# Patient Record
Sex: Male | Born: 1965 | Race: White | Hispanic: No | Marital: Married | Smoking: Former smoker
Health system: Southern US, Community
[De-identification: ages and names within clinical notes are randomized; demographics above are authoritative.]

## PROBLEM LIST (undated history)

## (undated) DIAGNOSIS — N62 Hypertrophy of breast: Secondary | ICD-10-CM

## (undated) DIAGNOSIS — L259 Unspecified contact dermatitis, unspecified cause: Secondary | ICD-10-CM

## (undated) HISTORY — DX: Hypertrophy of breast: N62

## (undated) HISTORY — PX: LITHOTRIPSY: SUR834

## (undated) HISTORY — DX: Unspecified contact dermatitis, unspecified cause: L25.9

---

## 2008-08-30 ENCOUNTER — Ambulatory Visit: Payer: Self-pay | Admitting: Gastroenterology

## 2009-09-08 HISTORY — PX: COLONOSCOPY: SHX174

## 2012-09-12 ENCOUNTER — Ambulatory Visit: Payer: Self-pay | Admitting: Emergency Medicine

## 2012-09-12 LAB — DOT URINE DIP
Blood: NEGATIVE
Protein: NEGATIVE
Specific Gravity: 1.015 (ref 1.003–1.030)

## 2012-09-17 ENCOUNTER — Ambulatory Visit: Payer: Self-pay

## 2014-08-21 ENCOUNTER — Ambulatory Visit: Payer: Self-pay | Admitting: Physician Assistant

## 2014-08-21 LAB — DOT URINE DIP
Blood: NEGATIVE
Glucose,UR: NEGATIVE
PROTEIN: NEGATIVE
SPECIFIC GRAVITY: 1.02 (ref 1.000–1.030)

## 2015-02-20 ENCOUNTER — Encounter: Payer: Self-pay | Admitting: Family Medicine

## 2015-02-20 ENCOUNTER — Ambulatory Visit (INDEPENDENT_AMBULATORY_CARE_PROVIDER_SITE_OTHER): Payer: BC Managed Care – PPO | Admitting: Family Medicine

## 2015-02-20 VITALS — BP 100/60 | HR 72 | Ht 70.0 in | Wt 202.0 lb

## 2015-02-20 DIAGNOSIS — L259 Unspecified contact dermatitis, unspecified cause: Secondary | ICD-10-CM

## 2015-02-20 HISTORY — DX: Unspecified contact dermatitis, unspecified cause: L25.9

## 2015-02-20 MED ORDER — PREDNISONE 10 MG PO TABS: ORAL_TABLET | ORAL | Status: DC

## 2015-02-20 NOTE — Progress Notes (Signed)
Name: Caleb Acosta   MRN: [id number]    DOB: 08/03/1966   Date:02/20/2015       Progress Note  Subjective  Chief Complaint  No chief complaint on file.   Rash This is a new problem. The current episode started in the past 7 days. The problem has been gradually improving since onset. The affected locations include the left hand and right hand. The rash is characterized by blistering, itchiness, swelling and redness. He was exposed to plant contact. Pertinent negatives include no anorexia, cough, diarrhea, facial edema, fever, joint pain, shortness of breath or sore throat. Past treatments include anti-itch cream, cold compress and antihistamine. The treatment provided moderate relief.    No problem-specific assessment & plan notes found for this encounter.   No past medical history on file.  No past surgical history on file.  No family history on file.  History   Social History  . Marital Status: Married    Spouse Name: N/A  . Number of Children: N/A  . Years of Education: N/A   Occupational History  . Not on file.   Social History Main Topics  . Smoking status: Former Games developer  . Smokeless tobacco: Not on file  . Alcohol Use: No  . Drug Use: No  . Sexual Activity: Not on file   Other Topics Concern  . Not on file   Social History Narrative  . No narrative on file    No Known Allergies   Review of Systems  Constitutional: Negative for fever, chills, weight loss and malaise/fatigue.  HENT: Negative for ear discharge, ear pain and sore throat.   Eyes: Negative for blurred vision.  Respiratory: Negative for cough, sputum production, shortness of breath and wheezing.   Cardiovascular: Negative for chest pain, palpitations and leg swelling.  Gastrointestinal: Negative for heartburn, nausea, abdominal pain, diarrhea, constipation, blood in stool, melena and anorexia.  Genitourinary: Negative for dysuria, urgency, frequency and hematuria.  Musculoskeletal:  Negative for myalgias, back pain, joint pain and neck pain.  Skin: Positive for rash.  Neurological: Negative for dizziness, tingling, sensory change, focal weakness and headaches.  Endo/Heme/Allergies: Negative for environmental allergies and polydipsia. Does not bruise/bleed easily.  Psychiatric/Behavioral: Negative for depression and suicidal ideas. The patient is not nervous/anxious and does not have insomnia.      Objective  Filed Vitals:   02/20/15 1527  Height: 5\' 10"  (1.778 m)  Weight: 202 lb (91.627 kg)    Physical Exam  Constitutional: He is oriented to person, place, and time and well-developed, well-nourished, and in no distress.  HENT:  Head: Normocephalic.  Right Ear: External ear normal.  Left Ear: External ear normal.  Nose: Nose normal.  Mouth/Throat: Oropharynx is clear and moist.  Eyes: Conjunctivae and EOM are normal. Pupils are equal, round, and reactive to light. Right eye exhibits no discharge. Left eye exhibits no discharge. No scleral icterus.  Neck: Normal range of motion. Neck supple. No JVD present. No tracheal deviation present. No thyromegaly present.  Cardiovascular: Normal rate, regular rhythm, normal heart sounds and intact distal pulses.  Exam reveals no gallop and no friction rub.   No murmur heard. Pulmonary/Chest: Breath sounds normal. No respiratory distress. He has no wheezes. He has no rales.  Abdominal: Soft. Bowel sounds are normal. He exhibits no mass. There is no hepatosplenomegaly. There is no tenderness. There is no rebound, no guarding and no CVA tenderness.  Musculoskeletal: Normal range of motion. He exhibits no edema or tenderness.  Lymphadenopathy:  He has no cervical adenopathy.  Neurological: He is alert and oriented to person, place, and time. He has normal sensation, normal strength, normal reflexes and intact cranial nerves. No cranial nerve deficit.  Skin: Skin is warm. Rash noted.  Vesicles /erythema   Psychiatric: Mood  and affect normal.      No results found for this or any previous visit (from the past 2160 hour(s)).   Assessment & Plan  Problem List Items Addressed This Visit    None        Dr. Elizabeth Sauer [facility]  02/20/2015

## 2015-03-08 ENCOUNTER — Ambulatory Visit (INDEPENDENT_AMBULATORY_CARE_PROVIDER_SITE_OTHER): Payer: BC Managed Care – PPO | Admitting: Family Medicine

## 2015-03-08 ENCOUNTER — Encounter: Payer: Self-pay | Admitting: Family Medicine

## 2015-03-08 VITALS — BP 110/80 | HR 62 | Ht 70.0 in | Wt 201.0 lb

## 2015-03-08 DIAGNOSIS — Z0000 Encounter for general adult medical examination without abnormal findings: Secondary | ICD-10-CM

## 2015-03-08 LAB — POCT URINALYSIS DIPSTICK
Bilirubin, UA: 0.2
Glucose, UA: NEGATIVE
KETONES UA: NEGATIVE
LEUKOCYTES UA: NEGATIVE
NITRITE UA: NEGATIVE
Protein, UA: NEGATIVE
RBC UA: NEGATIVE
SPEC GRAV UA: 1.01
Urobilinogen, UA: NEGATIVE
pH, UA: 5

## 2015-03-08 LAB — HEMOCCULT GUIAC POC 1CARD (OFFICE): Fecal Occult Blood, POC: NEGATIVE

## 2015-03-08 NOTE — Progress Notes (Signed)
Name: Caleb Acosta   MRN: [id number]    DOB: Dec 04, 1965   Date:03/08/2015       Progress Note  Subjective  Chief Complaint  Chief Complaint  Patient presents with  . Annual Exam    HPI Comments: Annual phy sical exam/ no concerns/ physical circumstances mental aspects.  preventitive for age discussed.   No problem-specific assessment & plan notes found for this encounter.   History reviewed. No pertinent past medical history.  Past Surgical History  Procedure Laterality Date  . Lithotripsy    . Colonoscopy  2011    Dr Servando SnareWohl- repeat in 5 years    History reviewed. No pertinent family history.  History   Social History  . Marital Status: Married    Spouse Name: N/A  . Number of Children: N/A  . Years of Education: N/A   Occupational History  . Not on file.   Social History Main Topics  . Smoking status: Former Games developermoker  . Smokeless tobacco: Not on file  . Alcohol Use: No  . Drug Use: No  . Sexual Activity: Yes   Other Topics Concern  . Not on file   Social History Narrative    No Known Allergies   ROS   Objective  Filed Vitals:   03/08/15 0840  BP: 110/80  Pulse: 62  Height: 5\' 10"  (1.778 m)  Weight: 201 lb (91.173 kg)    Physical Exam  Constitutional: He is oriented to person, place, and time and well-developed, well-nourished, and in no distress.  HENT:  Head: Normocephalic.  Right Ear: External ear normal.  Left Ear: External ear normal.  Nose: Nose normal.  Mouth/Throat: Oropharynx is clear and moist.  Eyes: Conjunctivae and EOM are normal. Pupils are equal, round, and reactive to light. Right eye exhibits no discharge. Left eye exhibits no discharge. No scleral icterus.  Neck: Normal range of motion. Neck supple. No JVD present. No tracheal deviation present. No thyromegaly present.  Cardiovascular: Normal rate, regular rhythm, normal heart sounds and intact distal pulses.  Exam reveals no gallop and no friction rub.   No murmur  heard. Pulmonary/Chest: Breath sounds normal. No respiratory distress. He has no wheezes. He has no rales. He exhibits no tenderness.  Abdominal: Soft. Bowel sounds are normal. He exhibits no mass. There is no hepatosplenomegaly. There is no tenderness. There is no rebound, no guarding and no CVA tenderness.  Genitourinary: Rectum normal and prostate normal. Guaiac negative stool.  Musculoskeletal: Normal range of motion. He exhibits no edema or tenderness.  Lymphadenopathy:    He has no cervical adenopathy.  Neurological: He is alert and oriented to person, place, and time. He has normal sensation, normal strength, normal reflexes and intact cranial nerves. No cranial nerve deficit.  Skin: Skin is warm. No rash noted.  Psychiatric: Mood and affect normal.  Nursing note and vitals reviewed.     Assessment & Plan  Problem List Items Addressed This Visit    None    Visit Diagnoses    Annual physical exam    -  Primary    Relevant Orders    POCT Occult Blood Stool    Renal Function Panel    Lipid Profile    PSA, total and free         Dr. Elizabeth Sauereanna Alegandro Macnaughton [facility]  03/08/2015

## 2015-03-09 LAB — RENAL FUNCTION PANEL
ALBUMIN: 4.4 g/dL (ref 3.5–5.5)
BUN/Creatinine Ratio: 22 — ABNORMAL HIGH (ref 9–20)
BUN: 16 mg/dL (ref 6–24)
CO2: 28 mmol/L (ref 18–29)
CREATININE: 0.74 mg/dL — AB (ref 0.76–1.27)
Calcium: 9.5 mg/dL (ref 8.7–10.2)
Chloride: 101 mmol/L (ref 97–108)
GFR calc non Af Amer: 108 mL/min/{1.73_m2} (ref >59)
GFR, EST AFRICAN AMERICAN: 124 mL/min/{1.73_m2} (ref >59)
GLUCOSE: 79 mg/dL (ref 65–99)
PHOSPHORUS: 3.2 mg/dL (ref 2.5–4.5)
Potassium: 4.8 mmol/L (ref 3.5–5.2)
Sodium: 141 mmol/L (ref 134–144)

## 2015-03-09 LAB — LIPID PANEL
Chol/HDL Ratio: 3.3 ratio units (ref 0.0–5.0)
Cholesterol, Total: 179 mg/dL (ref 100–199)
HDL: 55 mg/dL (ref >39)
LDL Calculated: 105 mg/dL — ABNORMAL HIGH (ref 0–99)
Triglycerides: 95 mg/dL (ref 0–149)
VLDL Cholesterol Cal: 19 mg/dL (ref 5–40)

## 2015-03-09 LAB — PSA, TOTAL AND FREE
PROSTATE SPECIFIC AG, SERUM: 0.5 ng/mL (ref 0.0–4.0)
PSA, Free Pct: 50 %
PSA, Free: 0.25 ng/mL

## 2016-08-13 ENCOUNTER — Ambulatory Visit
Admission: EM | Admit: 2016-08-13 | Discharge: 2016-08-13 | Disposition: A | Discharge status: Home / Self Care | Payer: Self-pay | Attending: Emergency Medicine | Admitting: Emergency Medicine

## 2016-08-13 ENCOUNTER — Encounter: Payer: Self-pay | Admitting: Emergency Medicine

## 2016-08-13 DIAGNOSIS — Z024 Encounter for examination for driving license: Secondary | ICD-10-CM

## 2016-08-13 DIAGNOSIS — H547 Unspecified visual loss: Secondary | ICD-10-CM

## 2016-08-13 LAB — DEPT OF TRANSP DIPSTICK, URINE (ARMC ONLY)
Glucose, UA: NEGATIVE mg/dL
HGB URINE DIPSTICK: NEGATIVE
Protein, ur: NEGATIVE mg/dL
Specific Gravity, Urine: 1.02 (ref 1.005–1.030)

## 2016-08-13 NOTE — ED Triage Notes (Signed)
Patient here for DOT Physical.  

## 2016-08-13 NOTE — ED Provider Notes (Signed)
CSN: [id number]     Arrival date & time 08/13/16  [id number] History   First MD Initiated Contact with Patient 08/13/16 0901     Chief Complaint  Patient presents with  . DOT Physical   (Consider location/radiation/quality/duration/timing/severity/associated sxs/prior Treatment) 50 year old male presents for routine DOT physical. Takes daily aspirin for CAD prevention. No chronic health issues. Takes no other medication. No complaints or concerns today.    The history is provided by the patient.    History reviewed. No pertinent past medical history. Past Surgical History:  Procedure Laterality Date  . COLONOSCOPY  2011   Dr Servando SnareWohl- repeat in 5 years  . LITHOTRIPSY     History reviewed. No pertinent family history. Social History  Substance Use Topics  . Smoking status: Former Games developermoker  . Smokeless tobacco: Never Used  . Alcohol use No    Review of Systems  Constitutional: Negative.   HENT: Negative.   Eyes: Negative.  Negative for visual disturbance.  Respiratory: Negative.   Cardiovascular: Negative.   Gastrointestinal: Negative.   Endocrine: Negative.   Genitourinary: Negative.   Musculoskeletal: Negative.   Skin: Negative.   Allergic/Immunologic: Negative.   Neurological: Negative.   Hematological: Negative.   Psychiatric/Behavioral: Negative.     Allergies  Patient has no known allergies.  Home Medications   Prior to Admission medications   Medication Sig Start Date End Date Taking? Authorizing Provider  aspirin 81 MG tablet Take 81 mg by mouth daily.    Historical Provider, MD   Meds Ordered and Administered this Visit  Medications - No data to display  BP 112/72 (BP Location: Left Arm)   Pulse 62   Temp 97.8 F (36.6 C) (Tympanic)   Resp 16   Ht 5' 9.5" (1.765 m)   Wt 211 lb (95.7 kg)   SpO2 100%   BMI 30.71 kg/m  No data found.   Physical Exam  Constitutional: He is oriented to person, place, and time. Vital signs are normal. He appears  well-developed and well-nourished. No distress.  HENT:  Head: Normocephalic and atraumatic.  Right Ear: Hearing, tympanic membrane, external ear and ear canal normal.  Left Ear: Hearing, tympanic membrane, external ear and ear canal normal.  Nose: Nose normal. Right sinus exhibits no maxillary sinus tenderness and no frontal sinus tenderness. Left sinus exhibits no maxillary sinus tenderness and no frontal sinus tenderness.  Mouth/Throat: Uvula is midline, oropharynx is clear and moist and mucous membranes are normal.  Eyes: Conjunctivae, EOM and lids are normal. Pupils are equal, round, and reactive to light.  Neck: Normal range of motion. Neck supple. No thyromegaly present.  Cardiovascular: Normal rate, regular rhythm, normal heart sounds, intact distal pulses and normal pulses.  Exam reveals no gallop and no friction rub.   No murmur heard. Pulmonary/Chest: Effort normal and breath sounds normal. No respiratory distress. He has no decreased breath sounds. He has no wheezes.  Abdominal: Soft. Bowel sounds are normal. He exhibits no distension and no mass. There is no tenderness. There is no rebound and no guarding. No hernia. Hernia confirmed negative in the right inguinal area and confirmed negative in the left inguinal area.  Musculoskeletal: Normal range of motion. He exhibits no edema, tenderness or deformity.  Lymphadenopathy:    He has no cervical adenopathy. No inguinal adenopathy noted on the right or left side.  Neurological: He is alert and oriented to person, place, and time. He has normal strength and normal reflexes. No cranial nerve  deficit or sensory deficit. He displays a negative Romberg sign.  Skin: Skin is warm and dry. Capillary refill takes less than 2 seconds. No rash noted.  Psychiatric: He has a normal mood and affect. His behavior is normal. Judgment and thought content normal.  Vitals reviewed.   Urgent Care Course   Clinical Course     Procedures (including  critical care time)  Labs Review Labs Reviewed  DEPT OF TRANSP DIPSTICK, URINE([facility] ONLY)    Imaging Review No results found.   Visual Acuity Review  Right Eye Distance: 20/50 uncorrected Left Eye Distance: 20/40 uncorrected Bilateral Distance: 20/30 uncorrected  Right Eye Near:   Left Eye Near:    Bilateral Near:         MDM   1. Encounter for commercial driver medical examination (CDME)   2. Decreased visual acuity    Discussed with patient that vision in right eye is 20/50 without correction but vision with both eyes is 20/30. Discussed that patient needs to see an Optometrist for further evaluation of decreased visual acuity. However, since he is 20/30 with both eyes and better than 20/40 which is the cut-off for passing the DOT exam, he will pass today. He will be certified for 2 years. Recommend follow-up with his Optometrist as planned and return in 2 years for next DOT exam.     Sudie GrumblingAnn Berry Caitlin Ainley, NP 08/13/16 2016  See Scanned DOT form for details.    Sudie GrumblingAnn Berry Cristianna Cyr, NP 08/13/16 2106

## 2016-08-13 NOTE — Discharge Instructions (Signed)
Recommend referral to Optometrist for corrective lenses. Since vision in both eyes is 20/30 and passes, will certify you for 2 years. However, you need to see the Optometrist for corrective lenses for vision to be improved to better than 20/40 in right eye in order to pass future exams.

## 2017-08-21 ENCOUNTER — Encounter: Payer: Self-pay | Admitting: Family Medicine

## 2017-08-21 ENCOUNTER — Ambulatory Visit: Payer: BC Managed Care – PPO | Admitting: Family Medicine

## 2017-08-21 VITALS — BP 102/60 | HR 68 | Ht 70.0 in | Wt 192.0 lb

## 2017-08-21 DIAGNOSIS — Z7689 Persons encountering health services in other specified circumstances: Secondary | ICD-10-CM | POA: Diagnosis not present

## 2017-08-21 DIAGNOSIS — Z23 Encounter for immunization: Secondary | ICD-10-CM

## 2017-08-21 DIAGNOSIS — N62 Hypertrophy of breast: Secondary | ICD-10-CM

## 2017-08-21 NOTE — Progress Notes (Signed)
Name: Caleb ClineDavid Lee Acosta   MRN: [id number]    DOB: 16-Nov-1965   Date:08/21/2017       Progress Note  Subjective  Chief Complaint  Chief Complaint  Patient presents with  . Establish Care    Patient present to reestablish care with physician. Patient concern about gynecomastia and ensueing quality of life    No problem-specific Assessment & Plan notes found for this encounter.   History reviewed. No pertinent past medical history.  Past Surgical History:  Procedure Laterality Date  . COLONOSCOPY  2011   Dr Servando SnareWohl- repeat in 5 years  . LITHOTRIPSY      History reviewed. No pertinent family history.  Social History   Socioeconomic History  . Marital status: Married    Spouse name: Not on file  . Number of children: Not on file  . Years of education: Not on file  . Highest education level: Not on file  Social Needs  . Financial resource strain: Not on file  . Food insecurity - worry: Not on file  . Food insecurity - inability: Not on file  . Transportation needs - medical: Not on file  . Transportation needs - non-medical: Not on file  Occupational History  . Not on file  Tobacco Use  . Smoking status: Former Games developermoker  . Smokeless tobacco: Never Used  Substance and Sexual Activity  . Alcohol use: No    Alcohol/week: 0.0 oz  . Drug use: No  . Sexual activity: Yes  Other Topics Concern  . Not on file  Social History Narrative  . Not on file    No Known Allergies  Outpatient Medications Prior to Visit  Medication Sig Dispense Refill  . aspirin 81 MG tablet Take 81 mg by mouth daily.     No facility-administered medications prior to visit.     Review of Systems  Constitutional: Negative for chills, fever, malaise/fatigue and weight loss.  HENT: Negative for ear discharge, ear pain and sore throat.   Eyes: Negative for blurred vision.  Respiratory: Negative for cough, sputum production, shortness of breath and wheezing.   Cardiovascular: Negative for  chest pain, palpitations and leg swelling.  Gastrointestinal: Negative for abdominal pain, blood in stool, constipation, diarrhea, heartburn, melena and nausea.  Genitourinary: Negative for dysuria, frequency, hematuria and urgency.  Musculoskeletal: Negative for back pain, joint pain, myalgias and neck pain.  Skin: Negative for rash.  Neurological: Negative for dizziness, tingling, sensory change, focal weakness and headaches.  Endo/Heme/Allergies: Negative for environmental allergies and polydipsia. Does not bruise/bleed easily.  Psychiatric/Behavioral: Negative for depression and suicidal ideas. The patient is not nervous/anxious and does not have insomnia.      Objective  Vitals:   08/21/17 1506  BP: 102/60  Pulse: 68  Weight: 192 lb (87.1 kg)  Height: 5\' 10"  (1.778 m)    Physical Exam  Constitutional: He is oriented to person, place, and time and well-developed, well-nourished, and in no distress.  HENT:  Head: Normocephalic.  Right Ear: External ear normal.  Left Ear: External ear normal.  Nose: Nose normal.  Mouth/Throat: Oropharynx is clear and moist.  Eyes: Conjunctivae and EOM are normal. Pupils are equal, round, and reactive to light. Right eye exhibits no discharge. Left eye exhibits no discharge. No scleral icterus.  Neck: Normal range of motion. Neck supple. No JVD present. No tracheal deviation present. No thyromegaly present.  Cardiovascular: Normal rate, regular rhythm, normal heart sounds and intact distal pulses. Exam reveals no gallop and no  friction rub.  No murmur heard. Pulmonary/Chest: Breath sounds normal. No respiratory distress. He has no wheezes. He has no rales. Right breast exhibits no inverted nipple, no mass, no nipple discharge, no skin change and no tenderness. Left breast exhibits no inverted nipple, no mass, no nipple discharge, no skin change and no tenderness. Breasts are symmetrical.  Gynecomastia noted/  Abdominal: Soft. Bowel sounds are  normal. He exhibits no mass. There is no hepatosplenomegaly. There is no tenderness. There is no rebound, no guarding and no CVA tenderness.  Musculoskeletal: Normal range of motion. He exhibits no edema or tenderness.  Lymphadenopathy:    He has no cervical adenopathy.  Neurological: He is alert and oriented to person, place, and time. He has normal sensation, normal strength, normal reflexes and intact cranial nerves. No cranial nerve deficit.  Skin: Skin is warm. No rash noted.  Psychiatric: Mood and affect normal.  Nursing note and vitals reviewed.     Assessment & Plan  Problem List Items Addressed This Visit    None    Visit Diagnoses    Establishing care with new doctor, encounter for    -  Primary   Gynecomastia       Relevant Orders   Ambulatory referral to General Surgery   Influenza vaccine needed       Relevant Orders   Flu Vaccine QUAD 36+ mos IM (Completed)      No orders of the defined types were placed in this encounter.     Dr. Hayden Rasmusseneanna Tarron Krolak [facility]  08/21/17

## 2017-08-27 ENCOUNTER — Ambulatory Visit (INDEPENDENT_AMBULATORY_CARE_PROVIDER_SITE_OTHER): Payer: BC Managed Care – PPO | Admitting: Surgery

## 2017-08-27 ENCOUNTER — Encounter: Payer: Self-pay | Admitting: Surgery

## 2017-08-27 VITALS — BP 123/77 | HR 80 | Temp 98.3°F | Ht 70.0 in | Wt 192.0 lb

## 2017-08-27 DIAGNOSIS — N62 Hypertrophy of breast: Secondary | ICD-10-CM | POA: Diagnosis not present

## 2017-08-27 DIAGNOSIS — Z1231 Encounter for screening mammogram for malignant neoplasm of breast: Secondary | ICD-10-CM | POA: Diagnosis not present

## 2017-08-27 NOTE — Patient Instructions (Addendum)
Please have your mammogram done and then we will see you back to give you your results.

## 2017-08-27 NOTE — Progress Notes (Signed)
Caleb ClineDavid Lee Acosta is an 51 y.o. male.   Chief Complaint: Gynecomastia  Referred by Dr. Yetta BarreJones  HPI: This patient with gynecomastia.  He has had this condition for greater than 10 years it causes him no pain but he states that he wanted to have something done about it and states that it is bothering him now when I asked him what he meant by "bothering" he pointed to his head suggesting that it is not physical pain etc.  He denies use of alcohol drugs or tobacco he stopped smoking 10 years ago Patient states that he stopped smoking and gained a lot of weight and lost weight recently as well. There is no family history of breast cancer not had any imaging done Patient reports that he is non-smoker nondrinker. Past Medical History:  Diagnosis Date  . Contact dermatitis 02/20/2015   Poison oak     Past Surgical History:  Procedure Laterality Date  . COLONOSCOPY  2011   Dr Servando SnareWohl- repeat in 5 years  . LITHOTRIPSY      No family history on file. Social History:  reports that he has quit smoking. he has never used smokeless tobacco. He reports that he does not drink alcohol or use drugs.  Allergies: No Known Allergies   (Not in a hospital admission)   Review of Systems:   Review of Systems  Constitutional: Negative.   HENT: Negative.   Eyes: Negative.   Respiratory: Negative.   Cardiovascular: Negative.   Gastrointestinal: Negative.   Genitourinary: Negative.   Musculoskeletal: Negative.   Skin: Negative.   Neurological: Negative.   Endo/Heme/Allergies: Negative.   Psychiatric/Behavioral: Negative.     Physical Exam:  Physical Exam  Constitutional: He is oriented to person, place, and time and well-developed, well-nourished, and in no distress. No distress.  HENT:  Head: Normocephalic and atraumatic.  Eyes: Pupils are equal, round, and reactive to light. Right eye exhibits no discharge. Left eye exhibits no discharge. No scleral icterus.  Neck: Normal range of motion.  No JVD present.  Cardiovascular: Normal rate and regular rhythm.  Pulmonary/Chest: Effort normal. No respiratory distress. He has no wheezes. He has no rales.  Breast exam:  Abdominal: Soft. He exhibits no distension. There is no tenderness. There is no rebound and no guarding.  Musculoskeletal: Normal range of motion. He exhibits no edema or tenderness.  Lymphadenopathy:    He has no cervical adenopathy.  Neurological: He is alert and oriented to person, place, and time.  Skin: Skin is warm and dry. No rash noted. He is not diaphoretic. No erythema.  Psychiatric: Mood and affect normal.  Vitals reviewed. Breast exam demonstrates no mass in either breast no axillary adenopathy there is no sign of glandular formation beneath the nipple to suggest gynecomastia.  There is diffuse fatty tissue gravity dependent. There were no vitals taken for this visit.    No results found for this or any previous visit (from the past 48 hour(s)). No results found.   Assessment/Plan Patient referred for gynecomastia.  I see no sign of glandular enlargement.  This is been going on for 10 years and he has had some weight gain than weight loss and this is all suggestive of fatty tissue.  This likely does not represent typical male gynecomastia.  I will obtain a mammogram bilaterally and follow-up in 2 weeks  Lattie Hawichard E Hiro Vipond, MD, FACS

## 2017-09-03 ENCOUNTER — Encounter: Payer: Self-pay | Admitting: Family Medicine

## 2017-09-03 ENCOUNTER — Ambulatory Visit (INDEPENDENT_AMBULATORY_CARE_PROVIDER_SITE_OTHER): Payer: BC Managed Care – PPO | Admitting: Family Medicine

## 2017-09-03 VITALS — BP 110/80 | HR 80 | Ht 70.0 in | Wt 195.0 lb

## 2017-09-03 DIAGNOSIS — E785 Hyperlipidemia, unspecified: Secondary | ICD-10-CM

## 2017-09-03 DIAGNOSIS — Z1211 Encounter for screening for malignant neoplasm of colon: Secondary | ICD-10-CM

## 2017-09-03 DIAGNOSIS — Z1159 Encounter for screening for other viral diseases: Secondary | ICD-10-CM | POA: Diagnosis not present

## 2017-09-03 DIAGNOSIS — Z0000 Encounter for general adult medical examination without abnormal findings: Secondary | ICD-10-CM

## 2017-09-03 DIAGNOSIS — Z23 Encounter for immunization: Secondary | ICD-10-CM | POA: Diagnosis not present

## 2017-09-03 DIAGNOSIS — R351 Nocturia: Secondary | ICD-10-CM

## 2017-09-03 LAB — HEMOCCULT GUIAC POC 1CARD (OFFICE): Fecal Occult Blood, POC: NEGATIVE

## 2017-09-03 NOTE — Progress Notes (Signed)
Name: Caleb ClineDavid Lee Acosta   MRN: [id number]    DOB: 04-16-1966   Date:09/03/2017       Progress Note  Subjective  Chief Complaint  Chief Complaint  Patient presents with  . Annual Exam    needs colonoscopy with Dr Servando SnareWohl- no issues, just time    Patient presents for annual physical exam.    No problem-specific Assessment & Plan notes found for this encounter.   Past Medical History:  Diagnosis Date  . 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 Gynecomastia, male     Past Surgical History:  Procedure Laterality Date  . COLONOSCOPY  2011   Dr Servando SnareWohl- repeat in 5 years  . LITHOTRIPSY      History reviewed. No pertinent family history.  Social History   Socioeconomic History  . Marital status: Married    Spouse name: Not on file  . Number of children: Not on file  . Years of education: Not on file  . Highest education level: Not on file  Social Needs  . Financial resource strain: Not on file  . Food insecurity - worry: Not on file  . Food insecurity - inability: Not on file  . Transportation needs - medical: Not on file  . Transportation needs - non-medical: Not on file  Occupational History  . Not on file  Tobacco Use  . Smoking status: Former Smoker    Types: Cigarettes    Last attempt to quit: 08/28/2007    Years since quitting: 10.0  . Smokeless tobacco: Never Used  Substance and Sexual Activity  . Alcohol use: No    Alcohol/week: 0.0 oz  . Drug use: No  . Sexual activity: Yes  Other Topics Concern  . Not on file  Social History Narrative  . Not on file    No Known Allergies  Outpatient Medications Prior to Visit  Medication Sig Dispense Refill  . aspirin 81 MG tablet Take 81 mg by mouth daily.     No facility-administered medications prior to visit.     Review of Systems  Constitutional: Negative for chills, fever, malaise/fatigue and weight loss.  HENT: Negative for ear discharge, ear pain and sore throat.   Eyes: Negative for blurred  vision.  Respiratory: Negative for cough, sputum production, shortness of breath and wheezing.   Cardiovascular: Negative for chest pain, palpitations and leg swelling.  Gastrointestinal: Negative for abdominal pain, blood in stool, constipation, diarrhea, heartburn, melena and nausea.  Genitourinary: Negative for dysuria, frequency, hematuria and urgency.       Nocturia  Musculoskeletal: Negative for back pain, joint pain, myalgias and neck pain.  Skin: Negative for rash.  Neurological: Negative for dizziness, tingling, sensory change, focal weakness and headaches.  Endo/Heme/Allergies: Negative for environmental allergies and polydipsia. Does not bruise/bleed easily.  Psychiatric/Behavioral: Negative for depression and suicidal ideas. The patient is not nervous/anxious and does not have insomnia.      Objective  Vitals:   09/03/17 0843  BP: 110/80  Pulse: 80  Weight: 195 lb (88.5 kg)  Height: 5\' 10"  (1.778 m)    Physical Exam  Constitutional: He is oriented to person, place, and time and well-developed, well-nourished, and in no distress.  HENT:  Head: Normocephalic and atraumatic.  Right Ear: Hearing, tympanic membrane, external ear and ear canal normal.  Left Ear: Hearing, tympanic membrane, external ear and ear canal normal.  Nose: Nose normal. No mucosal edema.  Mouth/Throat: Uvula is midline, oropharynx is clear and moist  and mucous membranes are normal. No oropharyngeal exudate, posterior oropharyngeal edema, posterior oropharyngeal erythema or tonsillar abscesses.  Eyes: Conjunctivae and EOM are normal. Pupils are equal, round, and reactive to light. Right eye exhibits no discharge. Left eye exhibits no discharge. No scleral icterus.  Fundoscopic exam:      The right eye shows no arteriolar narrowing and no AV nicking.       The left eye shows no arteriolar narrowing and no AV nicking.  Neck: Trachea normal and normal range of motion. Neck supple. Normal carotid pulses,  no hepatojugular reflux and no JVD present. Carotid bruit is not present. No tracheal deviation present. No thyroid mass and no thyromegaly present.  Cardiovascular: Normal rate, regular rhythm, S1 normal, S2 normal, normal heart sounds, intact distal pulses and normal pulses. PMI is not displaced. Exam reveals no gallop, no S3, no S4 and no friction rub.  No murmur heard. Pulmonary/Chest: Breath sounds normal. No respiratory distress. He has no wheezes. He has no rales. He exhibits no mass.  gynecomastia  Abdominal: Soft. Normal aorta and bowel sounds are normal. He exhibits no mass. There is no hepatosplenomegaly. There is no tenderness. There is no rebound, no guarding and no CVA tenderness.  Genitourinary: Rectum normal, prostate normal and testes/scrotum normal.  Musculoskeletal: Normal range of motion. He exhibits no edema or tenderness.       Thoracic back: Normal.       Lumbar back: Normal.  Lymphadenopathy:       Head (right side): No submandibular adenopathy present.       Head (left side): No submandibular adenopathy present.    He has no cervical adenopathy.  Neurological: He is alert and oriented to person, place, and time. He has normal motor skills, normal sensation, normal strength, normal reflexes and intact cranial nerves. No cranial nerve deficit.  Skin: Skin is warm and intact. No rash noted. He is not diaphoretic. No pallor.  Psychiatric: Mood and affect normal.  Nursing note and vitals reviewed.     Assessment & Plan  Problem List Items Addressed This Visit    None    Visit Diagnoses    Annual physical exam    -  Primary   Relevant Orders   Renal Function Panel   Colon cancer screening       Relevant Orders   Ambulatory referral to Gastroenterology   POCT occult blood stool (Completed)   Need for hepatitis C screening test       Relevant Orders   Hepatitis C antibody   Nocturia       Relevant Orders   PSA   Dyslipidemia       Relevant Orders   Lipid  panel   Need for diphtheria-tetanus-pertussis (Tdap) vaccine       Relevant Orders   Tdap vaccine greater than or equal to 7yo IM (Completed)      No orders of the defined types were placed in this encounter.     Dr. Hayden Rasmusseneanna Jones [facility]  09/03/17

## 2017-09-04 LAB — LIPID PANEL
CHOLESTEROL TOTAL: 192 mg/dL (ref 100–199)
Chol/HDL Ratio: 3.1 ratio (ref 0.0–5.0)
HDL: 61 mg/dL (ref >39)
LDL CALC: 122 mg/dL — AB (ref 0–99)
TRIGLYCERIDES: 46 mg/dL (ref 0–149)
VLDL CHOLESTEROL CAL: 9 mg/dL (ref 5–40)

## 2017-09-04 LAB — RENAL FUNCTION PANEL
Albumin: 4.3 g/dL (ref 3.5–5.5)
BUN / CREAT RATIO: 13 (ref 9–20)
BUN: 11 mg/dL (ref 6–24)
CALCIUM: 9.3 mg/dL (ref 8.7–10.2)
CHLORIDE: 101 mmol/L (ref 96–106)
CO2: 26 mmol/L (ref 20–29)
CREATININE: 0.86 mg/dL (ref 0.76–1.27)
GFR calc Af Amer: 114 mL/min/{1.73_m2} (ref >59)
GFR calc non Af Amer: 99 mL/min/{1.73_m2} (ref >59)
Glucose: 69 mg/dL (ref 65–99)
PHOSPHORUS: 3.8 mg/dL (ref 2.5–4.5)
Potassium: 4.7 mmol/L (ref 3.5–5.2)
SODIUM: 141 mmol/L (ref 134–144)

## 2017-09-04 LAB — PSA: Prostate Specific Ag, Serum: 0.5 ng/mL (ref 0.0–4.0)

## 2017-09-04 LAB — HEPATITIS C ANTIBODY: Hep C Virus Ab: <0.1 s/co ratio (ref 0.0–0.9)

## 2017-09-10 ENCOUNTER — Ambulatory Visit
Admission: RE | Admit: 2017-09-10 | Discharge: 2017-09-10 | Disposition: A | Discharge status: Home / Self Care | Payer: BC Managed Care – PPO | Source: Ambulatory Visit | Attending: Surgery | Admitting: Surgery

## 2017-09-10 ENCOUNTER — Ambulatory Visit (INDEPENDENT_AMBULATORY_CARE_PROVIDER_SITE_OTHER): Payer: BC Managed Care – PPO | Admitting: Surgery

## 2017-09-10 ENCOUNTER — Encounter: Payer: Self-pay | Admitting: Surgery

## 2017-09-10 VITALS — BP 120/78 | HR 67 | Temp 98.3°F | Ht 70.0 in | Wt 194.6 lb

## 2017-09-10 DIAGNOSIS — N62 Hypertrophy of breast: Secondary | ICD-10-CM

## 2017-09-10 NOTE — Progress Notes (Signed)
Outpatient Surgical Follow Up  09/10/2017  Caleb Acosta is an 52 y.o. male.   CC: Gynecomastia  HPI: This patient seen previously for gynecomastia and was sent for evaluation with radiology both mammogram and ultrasound.  Those have returned as normal.  Is concerned as to the cosmetic difficulties associated with his fatty tissue of the breast.  Past Medical History:  Diagnosis Date  . 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 Gynecomastia, male     Past Surgical History:  Procedure Laterality Date  . COLONOSCOPY  2011   Dr Servando Snare- repeat in 5 years  . LITHOTRIPSY      No family history on file.  Social History:  reports that he quit smoking about 10 years ago. His smoking use included cigarettes. he has never used smokeless tobacco. He reports that he does not drink alcohol or use drugs.  Allergies: No Known Allergies  Medications reviewed.   Review of Systems:   Review of Systems  Constitutional: Negative.   HENT: Negative.   Eyes: Negative.   Respiratory: Negative.   Cardiovascular: Negative.   Gastrointestinal: Negative.   Genitourinary: Negative.   Musculoskeletal: Negative.   Skin: Negative.   Neurological: Negative.   Endo/Heme/Allergies: Negative.   Psychiatric/Behavioral: Negative.      Physical Exam:  There were no vitals taken for this visit.  Physical Exam  Constitutional: He is oriented to person, place, and time and well-developed, well-nourished, and in no distress. No distress.  HENT:  Head: Normocephalic and atraumatic.  Eyes: Right eye exhibits no discharge. Left eye exhibits no discharge. No scleral icterus.  Neck: Normal range of motion.  Pulmonary/Chest: Effort normal. No respiratory distress.  Lymphadenopathy:    He has no cervical adenopathy.  Neurological: He is alert and oriented to person, place, and time.  Skin: Skin is warm and dry. He is not diaphoretic.  Vitals reviewed.  Bilateral breast exam demonstrates fatty  infiltration of both breasts with no nodularity no dominant mass and no gynecomastia beneath the nipple.  No results found for this or any previous visit (from the past 48 hour(s)). US Breast Ltd Uni Left Inc Axilla  Result Date: 09/10/2017 CLINICAL DATA:  Bilateral breast enlargement. Area of palpable concern in the right breast upper outer quadrant. EXAM: 2D DIGITAL DIAGNOSTIC BILATERAL MAMMOGRAM WITH CAD AND ADJUNCT TOMO ULTRASOUND BILATERAL BREAST COMPARISON:  Previous exam(s). ACR Breast Density Category a: The breast tissue is almost entirely fatty. FINDINGS: Mammographically, there are no suspicious masses, areas of architectural distortion or microcalcifications in either breast. The bilateral breast enlargement is due to fat accumulation. Mammographic images were processed with CAD. On physical exam, no suspicious masses are palpated. Targeted bile ultrasound is performed, showing no suspicious masses or shadowing lesions. IMPRESSION: No mammographic or sonographic evidence of malignancy in either breast. RECOMMENDATION: Further management of patient's right breast palpable area, and bilateral breast enlargement, should be based on clinical grounds. I have discussed the findings and recommendations with the patient. Results were also provided in writing at the conclusion of the visit. If applicable, a reminder letter will be sent to the patient regarding the next appointment. BI-RADS CATEGORY  2: Benign. Electronically Signed   By: Ted Mcalpine M.D.   On: 09/10/2017 10:31   US Breast Ltd Uni Right Inc Axilla  Result Date: 09/10/2017 CLINICAL DATA:  Bilateral breast enlargement. Area of palpable concern in the right breast upper outer quadrant. EXAM: 2D DIGITAL DIAGNOSTIC BILATERAL MAMMOGRAM WITH CAD AND ADJUNCT  TOMO ULTRASOUND BILATERAL BREAST COMPARISON:  Previous exam(s). ACR Breast Density Category a: The breast tissue is almost entirely fatty. FINDINGS: Mammographically, there are no  suspicious masses, areas of architectural distortion or microcalcifications in either breast. The bilateral breast enlargement is due to fat accumulation. Mammographic images were processed with CAD. On physical exam, no suspicious masses are palpated. Targeted bile ultrasound is performed, showing no suspicious masses or shadowing lesions. IMPRESSION: No mammographic or sonographic evidence of malignancy in either breast. RECOMMENDATION: Further management of patient's right breast palpable area, and bilateral breast enlargement, should be based on clinical grounds. I have discussed the findings and recommendations with the patient. Results were also provided in writing at the conclusion of the visit. If applicable, a reminder letter will be sent to the patient regarding the next appointment. BI-RADS CATEGORY  2: Benign. Electronically Signed   By: Ted Mcalpineobrinka  Dimitrova M.D.   On: 09/10/2017 10:31   Mm Diag Breast Tomo Bilateral  Result Date: 09/10/2017 CLINICAL DATA:  Bilateral breast enlargement. Area of palpable concern in the right breast upper outer quadrant. EXAM: 2D DIGITAL DIAGNOSTIC BILATERAL MAMMOGRAM WITH CAD AND ADJUNCT TOMO ULTRASOUND BILATERAL BREAST COMPARISON:  Previous exam(s). ACR Breast Density Category a: The breast tissue is almost entirely fatty. FINDINGS: Mammographically, there are no suspicious masses, areas of architectural distortion or microcalcifications in either breast. The bilateral breast enlargement is due to fat accumulation. Mammographic images were processed with CAD. On physical exam, no suspicious masses are palpated. Targeted bile ultrasound is performed, showing no suspicious masses or shadowing lesions. IMPRESSION: No mammographic or sonographic evidence of malignancy in either breast. RECOMMENDATION: Further management of patient's right breast palpable area, and bilateral breast enlargement, should be based on clinical grounds. I have discussed the findings and  recommendations with the patient. Results were also provided in writing at the conclusion of the visit. If applicable, a reminder letter will be sent to the patient regarding the next appointment. BI-RADS CATEGORY  2: Benign. Electronically Signed   By: Ted Mcalpineobrinka  Dimitrova M.D.   On: 09/10/2017 10:31    Assessment/Plan:  Films are reviewed.  No sign of malignancy.  This patient is concerned about the cosmetic issues surrounding his fatty infiltration of the breast.  I see no sign of malignancy no need for surgical intervention for diagnosis.  It is clearly some fatty infiltration of the breast bilaterally.  I discussed with him his cosmetic results.  I suggested that if he were to want to pursue this a plastic surgeon could perform and a minimally invasive procedure such as liposuction etc. we can refer him as needed.  Follow-up with us on an as-needed basis as well  Lattie Hawichard E Travious Vanover, MD, FACS

## 2017-09-10 NOTE — Patient Instructions (Signed)
We will send the referral to [facility].  Someone from their office will call you to schedule an appointment.  If you do not hear from someone in 5-7 days days please call our office and let us know so we can check on this for you.  Please call our office with any questions or concerns.

## 2017-09-16 ENCOUNTER — Telehealth: Payer: Self-pay | Admitting: Surgery

## 2017-09-16 NOTE — Telephone Encounter (Signed)
I have faxed a referral to Dr Margarita Sermonsoan's office for gynecomastia. This was faxed to [phone] The office will call the patient for appointment, however the first available is not till after February.   All clinic notes, imaging and demographics have also been faxed.   Phone# [phone].

## 2017-09-21 ENCOUNTER — Telehealth: Payer: Self-pay

## 2017-09-21 NOTE — Telephone Encounter (Signed)
Gastroenterology Pre-Procedure Review  Request Date: Requesting Physician: Dr.   PATIENT REVIEW QUESTIONS: The patient responded to the following health history questions as indicated:    1. Are you having any GI issues? No  2. Do you have a personal history of Polyps? Yes  3. Do you have a family history of Colon Cancer or Polyps? No  4. Diabetes Mellitus? No  5. Joint replacements in the past 12 months? No  6. Major health problems in the past 3 months? No  7. Any artificial heart valves, MVP, or defibrillator? No     MEDICATIONS & ALLERGIES:    Patient reports the following regarding taking any anticoagulation/antiplatelet therapy:   Plavix, Coumadin, Eliquis, Xarelto, Lovenox, Pradaxa, Brilinta, or Effient? No  Aspirin? No   Patient confirms/reports the following medications:  No current outpatient medications on file.   No current facility-administered medications for this visit.     Patient confirms/reports the following allergies:  No Known Allergies  No orders of the defined types were placed in this encounter.   AUTHORIZATION INFORMATION Primary Insurance: 1D#: Group #:  Secondary Insurance: 1D#: Group #:  SCHEDULE INFORMATION: Date: 10/05/17 Time: Location: [place]

## 2017-09-24 ENCOUNTER — Other Ambulatory Visit: Payer: Self-pay

## 2017-09-24 ENCOUNTER — Telehealth: Payer: Self-pay

## 2017-09-24 DIAGNOSIS — Z1211 Encounter for screening for malignant neoplasm of colon: Secondary | ICD-10-CM

## 2017-09-24 NOTE — Telephone Encounter (Signed)
Gastroenterology Pre-Procedure Review  Request Date: 10/05/17 Requesting Physician: Dr. Servando SnareWohl  PATIENT REVIEW QUESTIONS: The patient responded to the following health history questions as indicated:    1. Are you having any GI issues? no 2. Do you have a personal history of Polyps? yes (unsure of last colonoscopy) 3. Do you have a family history of Colon Cancer or Polyps? no 4. Diabetes Mellitus? no 5. Joint replacements in the past 12 months?no 6. Major health problems in the past 3 months?no 7. Any artificial heart valves, MVP, or defibrillator?no    MEDICATIONS & ALLERGIES:    Patient reports the following regarding taking any anticoagulation/antiplatelet therapy:   Plavix, Coumadin, Eliquis, Xarelto, Lovenox, Pradaxa, Brilinta, or Effient? no Aspirin? no  Patient confirms/reports the following medications:  No current outpatient medications on file.   No current facility-administered medications for this visit.     Patient confirms/reports the following allergies:  No Known Allergies  No orders of the defined types were placed in this encounter.   AUTHORIZATION INFORMATION Primary Insurance: 1D#: Group #:  Secondary Insurance: 1D#: Group #:  SCHEDULE INFORMATION: Date: 10/05/17 Time: Location:[facility]

## 2017-09-29 ENCOUNTER — Other Ambulatory Visit: Payer: Self-pay

## 2017-09-29 MED ORDER — PEG 3350-KCL-NABCB-NACL-NASULF 236 G PO SOLR: ORAL | 0 refills | Status: DC

## 2017-09-30 ENCOUNTER — Telehealth: Payer: Self-pay | Admitting: Gastroenterology

## 2017-09-30 ENCOUNTER — Encounter: Payer: Self-pay | Admitting: *Deleted

## 2017-09-30 ENCOUNTER — Encounter: Payer: Self-pay | Admitting: Student in an Organized Health Care Education/Training Program

## 2017-09-30 ENCOUNTER — Other Ambulatory Visit: Payer: Self-pay

## 2017-09-30 NOTE — Telephone Encounter (Signed)
Patient wants to cancel colonoscopy due to insurance no paying on procedure.

## 2017-10-05 ENCOUNTER — Ambulatory Visit
Admission: RE | Admit: 2017-10-05 | Payer: BC Managed Care – PPO | Source: Ambulatory Visit | Admitting: Gastroenterology

## 2017-10-05 ENCOUNTER — Telehealth: Payer: Self-pay | Admitting: Surgery

## 2017-10-05 SURGERY — COLONOSCOPY WITH PROPOFOL
Anesthesia: Choice

## 2017-10-05 NOTE — Telephone Encounter (Signed)
Patient was referred to Dr Meriam Spragueoan per Dr Excell Seltzerooper for gynecomastia.   Appt with Dr Meriam Spragueoan was on 09/23/17. Patient's surgery has been scheduled with Dr Meriam Spragueoan on 10/22/17.

## 2018-09-06 ENCOUNTER — Ambulatory Visit (INDEPENDENT_AMBULATORY_CARE_PROVIDER_SITE_OTHER): Payer: BC Managed Care – PPO | Admitting: Family Medicine

## 2018-09-06 ENCOUNTER — Encounter: Payer: Self-pay | Admitting: Family Medicine

## 2018-09-06 VITALS — BP 120/80 | HR 80 | Ht 70.0 in | Wt 200.0 lb

## 2018-09-06 DIAGNOSIS — R351 Nocturia: Secondary | ICD-10-CM

## 2018-09-06 DIAGNOSIS — Z0000 Encounter for general adult medical examination without abnormal findings: Secondary | ICD-10-CM | POA: Diagnosis not present

## 2018-09-06 NOTE — Progress Notes (Addendum)
Date:  09/06/2018   Name:  Caleb Acosta   DOB:  [birth year]   MRN:  [id number]   Chief Complaint: Annual Exam (no concerns)  Patient is a 52 year old male who presents for a comprehensive physical exam. The patient reports the following problems: none. Health maintenance has been reviewed up to date.   Review of Systems  Constitutional: Negative for appetite change, chills, fatigue, fever and unexpected weight change.  HENT: Negative for drooling, ear discharge, ear pain, facial swelling, hearing loss, nosebleeds, sneezing, sore throat and trouble swallowing.   Eyes: Negative for photophobia, pain, discharge, redness, itching and visual disturbance.  Respiratory: Negative for cough, choking, chest tightness, shortness of breath and wheezing.   Cardiovascular: Negative for chest pain, palpitations and leg swelling.  Gastrointestinal: Negative for abdominal pain, blood in stool, constipation, diarrhea, nausea, rectal pain and vomiting.  Endocrine: Negative for cold intolerance, heat intolerance, polydipsia, polyphagia and polyuria.  Genitourinary: Negative for decreased urine volume, difficulty urinating, discharge, dysuria, flank pain, frequency, hematuria, penile pain, penile swelling, scrotal swelling, testicular pain and urgency.  Musculoskeletal: Negative for back pain, joint swelling, myalgias, neck pain and neck stiffness.  Skin: Negative for color change and rash.  Allergic/Immunologic: Negative for environmental allergies and immunocompromised state.  Neurological: Negative for dizziness, tremors, seizures, syncope, speech difficulty, weakness, light-headedness, numbness and headaches.  Hematological: Does not bruise/bleed easily.  Psychiatric/Behavioral: Negative for agitation, behavioral problems, confusion, dysphoric mood, hallucinations, self-injury and suicidal ideas. The patient is not nervous/anxious.     Patient Active Problem List   Diagnosis Date Noted  .  Contact dermatitis 02/20/2015    No Known Allergies  Past Surgical History:  Procedure Laterality Date  . COLONOSCOPY  2011   Dr Servando Snare- repeat in 5 years  . LITHOTRIPSY      Social History   Tobacco Use  . Smoking status: Former Smoker    Types: Cigarettes    Last attempt to quit: 08/28/2007    Years since quitting: 11.0  . Smokeless tobacco: Never Used  Substance Use Topics  . Alcohol use: No    Alcohol/week: 0.0 standard drinks  . Drug use: No     Medication list has been reviewed and updated.  No outpatient medications have been marked as taking for the 09/06/18 encounter (Office Visit) with Duanne Limerick, MD.    [facility] 2/9 Scores 09/06/2018 09/03/2017 02/20/2015  PHQ - 2 Score 0 0 0  PHQ- 9 Score 0 0 -    Physical Exam Vitals signs and nursing note reviewed.  Constitutional:      Appearance: Normal appearance.  HENT:     Head: Normocephalic.     Right Ear: Hearing, tympanic membrane, ear canal and external ear normal. No decreased hearing noted.     Left Ear: Hearing, tympanic membrane, ear canal and external ear normal. No decreased hearing noted.     Nose: Nose normal. No nasal deformity.     Right Turbinates: Not enlarged.     Left Turbinates: Not enlarged.     Mouth/Throat:     Lips: Pink.     Mouth: Mucous membranes are moist. Mucous membranes are pale.     Palate: No mass and lesions.     Pharynx: Oropharynx is clear. Uvula midline. No pharyngeal swelling or oropharyngeal exudate.     Tonsils: No tonsillar exudate or tonsillar abscesses.  Eyes:     General: Lids are normal. No scleral icterus.  Right eye: No discharge.        Left eye: No discharge.     Conjunctiva/sclera: Conjunctivae normal.     Pupils: Pupils are equal, round, and reactive to light.     Funduscopic exam:    Right eye: Red reflex present.        Left eye: Red reflex present. Neck:     Musculoskeletal: Normal range of motion and neck supple. No edema or erythema.      Thyroid: No thyroid mass, thyromegaly or thyroid tenderness.     Vascular: Normal carotid pulses. No carotid bruit, hepatojugular reflux or JVD.     Trachea: Trachea normal. No tracheal deviation.  Cardiovascular:     Rate and Rhythm: Normal rate and regular rhythm.     Chest Wall: PMI is not displaced.     Pulses:          Carotid pulses are 2+ on the right side and 2+ on the left side.      Radial pulses are 2+ on the right side and 2+ on the left side.       Femoral pulses are 2+ on the right side and 2+ on the left side.      Popliteal pulses are 2+ on the right side and 2+ on the left side.       Dorsalis pedis pulses are 2+ on the right side and 2+ on the left side.       Posterior tibial pulses are 2+ on the right side and 2+ on the left side.     Heart sounds: Normal heart sounds, S1 normal and S2 normal. No murmur. No systolic murmur. No friction rub. No gallop. No S3 or S4 sounds.   Pulmonary:     Effort: No respiratory distress.     Breath sounds: Normal breath sounds. No decreased breath sounds, wheezing, rhonchi or rales.  Chest:     Chest wall: No mass.     Breasts: Breasts are symmetrical.        Right: Normal.        Left: Normal.     Comments: Noted scars Abdominal:     General: Bowel sounds are normal. There is no abdominal bruit.     Palpations: Abdomen is soft. There is no mass.     Tenderness: There is no abdominal tenderness. There is no guarding or rebound.     Hernia: There is no hernia in the right inguinal area or left inguinal area.  Genitourinary:    Scrotum/Testes: Normal.        Right: Mass or tenderness not present.        Left: Mass or tenderness not present.     Epididymis:     Right: Normal.     Left: Normal.     Prostate: Normal. Not enlarged, not tender and no nodules present.     Rectum: Normal. Guaiac result negative. No mass.  Musculoskeletal: Normal range of motion.        General: No tenderness.     Right lower leg: No edema.     Left  lower leg: No edema.  Lymphadenopathy:     Head:     Right side of head: No submandibular adenopathy.     Left side of head: No submandibular adenopathy.     Cervical: No cervical adenopathy.     Upper Body:     Right upper body: No axillary adenopathy.     Left upper body: No  axillary adenopathy.  Skin:    General: Skin is warm.     Capillary Refill: Capillary refill takes less than 2 seconds.     Coloration: Skin is not jaundiced or pale.     Findings: Abrasion present. No rash.     Comments: Abrasion ear  Neurological:     General: No focal deficit present.     Mental Status: He is alert and oriented to person, place, and time.     Cranial Nerves: Cranial nerves are intact. No cranial nerve deficit.     Sensory: Sensation is intact.     Motor: Motor function is intact. No weakness.     Deep Tendon Reflexes: Reflexes are normal and symmetric.  Psychiatric:        Behavior: Behavior is cooperative.     BP 120/80   Pulse 80   Ht 5\' 10"  (1.778 m)   Wt 200 lb (90.7 kg)   BMI 28.70 kg/m   Assessment and Plan: 1. Annual physical exam No subjective/objective concerns noted on history nor physical.  Will check lipid panel and renal panel. - Lipid panel - Renal function panel Caleb Acosta is a 52 y.o. male who presents today for his Complete Annual Exam. He feels well. He reports exercising. He reports he is sleeping well. Immunizations are reviewed and recommendations provided.   Age appropriate screening tests are discussed. Counseling given for risk factor reduction interventions. 2. Nocturia Patient relates occasional nocturia usually on a nightly basis will check PSA. - PSA - CBC with Differential/Platelet

## 2018-09-16 ENCOUNTER — Other Ambulatory Visit: Payer: BC Managed Care – PPO

## 2018-09-16 DIAGNOSIS — R351 Nocturia: Secondary | ICD-10-CM

## 2018-09-16 DIAGNOSIS — E785 Hyperlipidemia, unspecified: Secondary | ICD-10-CM

## 2018-09-17 LAB — CBC WITH DIFFERENTIAL/PLATELET
BASOS ABS: 0.1 10*3/uL (ref 0.0–0.2)
BASOS: 1 %
EOS (ABSOLUTE): 0.2 10*3/uL (ref 0.0–0.4)
Eos: 3 %
Hematocrit: 44.5 % (ref 37.5–51.0)
Hemoglobin: 14.8 g/dL (ref 13.0–17.7)
Immature Grans (Abs): 0 10*3/uL (ref 0.0–0.1)
Immature Granulocytes: 0 %
Lymphocytes Absolute: 1.7 10*3/uL (ref 0.7–3.1)
Lymphs: 27 %
MCH: 29.6 pg (ref 26.6–33.0)
MCHC: 33.3 g/dL (ref 31.5–35.7)
MCV: 89 fL (ref 79–97)
MONOS ABS: 0.5 10*3/uL (ref 0.1–0.9)
Monocytes: 8 %
NEUTROS ABS: 4 10*3/uL (ref 1.4–7.0)
NEUTROS PCT: 61 %
PLATELETS: 240 10*3/uL (ref 150–450)
RBC: 5 x10E6/uL (ref 4.14–5.80)
RDW: 13 % (ref 11.6–15.4)
WBC: 6.4 10*3/uL (ref 3.4–10.8)

## 2018-09-17 LAB — LIPID PANEL WITH LDL/HDL RATIO
CHOLESTEROL TOTAL: 185 mg/dL (ref 100–199)
HDL: 57 mg/dL (ref >39)
LDL CALC: 116 mg/dL — AB (ref 0–99)
LDL/HDL RATIO: 2 ratio (ref 0.0–3.6)
TRIGLYCERIDES: 58 mg/dL (ref 0–149)
VLDL CHOLESTEROL CAL: 12 mg/dL (ref 5–40)

## 2018-09-17 LAB — PSA: Prostate Specific Ag, Serum: 0.5 ng/mL (ref 0.0–4.0)

## 2019-05-09 ENCOUNTER — Ambulatory Visit: Payer: Self-pay | Admitting: Family Medicine

## 2019-05-09 ENCOUNTER — Encounter: Payer: Self-pay | Admitting: Family Medicine

## 2019-05-09 ENCOUNTER — Other Ambulatory Visit: Payer: Self-pay

## 2019-05-09 VITALS — BP 100/60 | HR 72 | Ht 70.0 in | Wt 190.0 lb

## 2019-05-09 DIAGNOSIS — Z23 Encounter for immunization: Secondary | ICD-10-CM

## 2019-05-09 DIAGNOSIS — R55 Syncope and collapse: Secondary | ICD-10-CM

## 2019-05-09 NOTE — Progress Notes (Signed)
Date:  05/09/2019   Name:  Caleb Acosta   DOB:  Oct 08, 1965   MRN:  [id number]   Chief Complaint: Epistaxis (started sat night, got dizzy and felt light- headed. EMS came to house- b/p was normal.) and influenza vacc need  Epistaxis  The bleeding has been from the right nare. This is a new problem. Episode frequency: one episode. The problem has been resolved. The bleeding is associated with nothing. He has tried nothing for the symptoms.  Loss of Consciousness Chronicity: for near syncope. The current episode started in the past 7 days (saturday night). There was no loss of consciousness. Exacerbated by: nose bleed. Associated symptoms include dizziness and light-headedness. Pertinent negatives include no abdominal pain, back pain, chest pain, diaphoresis, fever, focal sensory loss, headaches, nausea, palpitations or slurred speech. Associated symptoms comments: Just felt lightheaded/hypotensive. The treatment provided mild relief. There is no history of arrhythmia, CAD, a clotting disorder, CVA, DM, HTN, seizures, a sudden death in family, TIA or vertigo.    Review of Systems  Constitutional: Negative for chills, diaphoresis and fever.  HENT: Positive for nosebleeds. Negative for drooling, ear discharge, ear pain and sore throat.   Respiratory: Negative for cough, shortness of breath and wheezing.   Cardiovascular: Positive for syncope. Negative for chest pain, palpitations and leg swelling.  Gastrointestinal: Negative for abdominal pain, blood in stool, constipation, diarrhea and nausea.  Endocrine: Negative for polydipsia.  Genitourinary: Negative for dysuria, frequency, hematuria and urgency.  Musculoskeletal: Negative for back pain, myalgias and neck pain.  Skin: Negative for rash.  Allergic/Immunologic: Negative for environmental allergies.  Neurological: Positive for dizziness and light-headedness. Negative for tremors, seizures, syncope, facial asymmetry, numbness and  headaches.       Questionable speech "drag"  Hematological: Does not bruise/bleed easily.  Psychiatric/Behavioral: Negative for suicidal ideas. The patient is not nervous/anxious.     Patient Active Problem List   Diagnosis Date Noted  . Contact dermatitis 02/20/2015    No Known Allergies  Past Surgical History:  Procedure Laterality Date  . COLONOSCOPY  2011   Dr Allen Norris- repeat in 5 years  . LITHOTRIPSY      Social History   Tobacco Use  . Smoking status: Former Smoker    Types: Cigarettes    Quit date: 08/28/2007    Years since quitting: 11.7  . Smokeless tobacco: Never Used  Substance Use Topics  . Alcohol use: No    Alcohol/week: 0.0 standard drinks  . Drug use: No     Medication list has been reviewed and updated.  No outpatient medications have been marked as taking for the 05/09/19 encounter (Office Visit) with Juline Patch, MD.    [facility] 2/9 Scores 05/09/2019 09/06/2018 09/03/2017 02/20/2015  PHQ - 2 Score 0 0 0 0  PHQ- 9 Score 0 0 0 -    BP Readings from Last 3 Encounters:  05/09/19 100/60  09/06/18 120/80  09/10/17 120/78    Physical Exam Vitals signs and nursing note reviewed.  HENT:     Head: Normocephalic.     Right Ear: Tympanic membrane, ear canal and external ear normal.     Left Ear: Tympanic membrane, ear canal and external ear normal.     Nose: Nose normal.  Eyes:     General: No scleral icterus.       Right eye: No discharge.        Left eye: No discharge.     Conjunctiva/sclera: Conjunctivae normal.  Pupils: Pupils are equal, round, and reactive to light.  Neck:     Musculoskeletal: Normal range of motion and neck supple.     Thyroid: No thyromegaly.     Vascular: No JVD.     Trachea: No tracheal deviation.  Cardiovascular:     Rate and Rhythm: Normal rate and regular rhythm.     Heart sounds: Normal heart sounds. No murmur. No friction rub. No gallop.   Pulmonary:     Effort: No respiratory distress.     Breath sounds:  Normal breath sounds. No wheezing or rales.  Abdominal:     General: Bowel sounds are normal.     Palpations: Abdomen is soft. There is no mass.     Tenderness: There is no abdominal tenderness. There is no guarding or rebound.  Musculoskeletal: Normal range of motion.        General: No tenderness.  Lymphadenopathy:     Cervical: No cervical adenopathy.  Skin:    General: Skin is warm.     Findings: No rash.  Neurological:     General: No focal deficit present.     Mental Status: He is alert and oriented to person, place, and time.     Cranial Nerves: Cranial nerves are intact. No cranial nerve deficit.     Sensory: Sensation is intact.     Motor: Motor function is intact.     Deep Tendon Reflexes: Reflexes are normal and symmetric.     Reflex Scores:      Tricep reflexes are 2+ on the right side and 2+ on the left side.      Bicep reflexes are 2+ on the right side and 2+ on the left side.      Brachioradialis reflexes are 2+ on the right side and 2+ on the left side.      Patellar reflexes are 2+ on the right side and 2+ on the left side.      Achilles reflexes are 2+ on the right side and 2+ on the left side.    Wt Readings from Last 3 Encounters:  05/09/19 190 lb (86.2 kg)  09/06/18 200 lb (90.7 kg)  09/10/17 194 lb 9.6 oz (88.3 kg)    BP 100/60   Pulse 72   Ht 5\' 10"  (1.778 m)   Wt 190 lb (86.2 kg)   BMI 27.26 kg/m   Assessment and Plan:  1. Vasovagal near syncope Patient had an episode of near syncope when he noted a epistaxis episode of one nostril.  Patient has had similar feelings when he seen blood in the past and this would suggest that there was a vasovagal phenomenon.  This was discussed with patient and the blood pressure being low would likely suggest this as well.  Patient has been instructed to return if he should start having any other cardiac episodes such as palpitations or chest pain.  2. Influenza vaccine needed Discussed and administered. - Flu  Vaccine QUAD 6+ mos PF IM (Fluarix Quad PF)

## 2019-05-09 NOTE — Patient Instructions (Signed)
Near-Syncope Near-syncope is when you suddenly feel like you might pass out (faint), but you do not actually lose consciousness. This may also be referred to as presyncope. During an episode of near-syncope, you may:  Feel dizzy, weak, or light-headed.  Feel nauseous.  See all white or all black in your field of vision, or see spots.  Have cold, clammy skin. This condition is caused by a sudden decrease in blood flow to the brain. This decrease can result from various causes, but most of those causes are not dangerous. However, near-syncope may be a sign of a serious medical problem, so it is important to seek medical care. Follow these instructions at home: Medicines  Take over-the-counter and prescription medicines only as told by your health care provider.  If you are taking blood pressure or heart medicine, get up slowly and take several minutes to sit and then stand. This can reduce dizziness. General instructions  Pay attention to any changes in your symptoms.  Talk with your health care provider about your symptoms. You may need to have testing to understand the cause of your near-syncope.  If you start to feel like you might faint, lie down right away and raise (elevate) your feet above the level of your heart. Breathe deeply and steadily. Wait until all of the symptoms have passed.  Have someone stay with you until you feel stable.  Do not drive, use machinery, or play sports until your health care provider says it is okay.  Drink enough fluid to keep your urine pale yellow.  Keep all follow-up visits as told by your health care provider. This is important. Get help right away if you:  Have a seizure.  Have unusual pain in your chest, abdomen, or back.  Faint once or repeatedly.  Have a severe headache.  Are bleeding from your mouth or rectum, or you have black or tarry stool.  Have a very fast or irregular heartbeat (palpitations).  Are confused.  Have  trouble walking.  Have severe weakness.  Have vision problems. These symptoms may represent a serious problem that is an emergency. Do not wait to see if your symptoms will go away. Get medical help right away. Call your local emergency services (911 in the [place] Do not drive yourself to the hospital. Summary  Near-syncope is when you suddenly feel like you might pass out (faint), but you do not actually lose consciousness.  This condition is caused by a sudden decrease in blood flow to the brain. This decrease can result from various causes, but most of those causes are not dangerous.  Near-syncope may be a sign of a serious medical problem, so it is important to seek medical care. This information is not intended to replace advice given to you by your health care provider. Make sure you discuss any questions you have with your health care provider. Document Released: 08/25/2005 Document Revised: 12/17/2018 Document Reviewed: 07/14/2018 [employer] Patient Education  2020 [employer]
